# Patient Record
Sex: Female | Born: 1970 | Race: White | Hispanic: No | Marital: Married | Smoking: Never smoker
Health system: Southern US, Community
[De-identification: ages and names within clinical notes are randomized; demographics above are authoritative.]

## PROBLEM LIST (undated history)

## (undated) DIAGNOSIS — M419 Scoliosis, unspecified: Secondary | ICD-10-CM

## (undated) DIAGNOSIS — M810 Age-related osteoporosis without current pathological fracture: Secondary | ICD-10-CM

---

## 2004-04-21 ENCOUNTER — Other Ambulatory Visit: Admission: RE | Admit: 2004-04-21 | Discharge: 2004-04-21 | Payer: Self-pay | Admitting: Obstetrics and Gynecology

## 2021-03-26 ENCOUNTER — Other Ambulatory Visit: Payer: Self-pay

## 2021-03-26 ENCOUNTER — Emergency Department (HOSPITAL_BASED_OUTPATIENT_CLINIC_OR_DEPARTMENT_OTHER)
Admission: EM | Admit: 2021-03-26 | Discharge: 2021-03-26 | Disposition: A | Discharge status: Home / Self Care | Payer: No Typology Code available for payment source | Attending: Emergency Medicine | Admitting: Emergency Medicine

## 2021-03-26 ENCOUNTER — Encounter (HOSPITAL_BASED_OUTPATIENT_CLINIC_OR_DEPARTMENT_OTHER): Payer: Self-pay | Admitting: Emergency Medicine

## 2021-03-26 ENCOUNTER — Emergency Department (HOSPITAL_BASED_OUTPATIENT_CLINIC_OR_DEPARTMENT_OTHER): Payer: No Typology Code available for payment source | Admitting: Radiology

## 2021-03-26 DIAGNOSIS — M24811 Other specific joint derangements of right shoulder, not elsewhere classified: Secondary | ICD-10-CM | POA: Insufficient documentation

## 2021-03-26 DIAGNOSIS — S4991XA Unspecified injury of right shoulder and upper arm, initial encounter: Secondary | ICD-10-CM | POA: Insufficient documentation

## 2021-03-26 DIAGNOSIS — T1490XA Injury, unspecified, initial encounter: Secondary | ICD-10-CM

## 2021-03-26 DIAGNOSIS — Y93E5 Activity, floor mopping and cleaning: Secondary | ICD-10-CM | POA: Insufficient documentation

## 2021-03-26 DIAGNOSIS — X509XXA Other and unspecified overexertion or strenuous movements or postures, initial encounter: Secondary | ICD-10-CM | POA: Diagnosis not present

## 2021-03-26 HISTORY — DX: Scoliosis, unspecified: M41.9

## 2021-03-26 HISTORY — DX: Age-related osteoporosis without current pathological fracture: M81.0

## 2021-03-26 MED ORDER — OXYCODONE-ACETAMINOPHEN 5-325 MG PO TABS
1.0000 | ORAL_TABLET | Freq: Once | ORAL | Status: AC
  Administered 2021-03-26: 1 via ORAL
  Filled 2021-03-26: qty 1

## 2021-03-26 MED ORDER — PREDNISONE 50 MG PO TABS
60.0000 mg | ORAL_TABLET | Freq: Once | ORAL | Status: AC
  Administered 2021-03-26: 60 mg via ORAL
  Filled 2021-03-26: qty 1

## 2021-03-26 MED ORDER — OXYCODONE-ACETAMINOPHEN 5-325 MG PO TABS: 1.0000 | ORAL_TABLET | ORAL | 0 refills | Status: AC | PRN

## 2021-03-26 MED ORDER — PREDNISONE 20 MG PO TABS: ORAL_TABLET | ORAL | 0 refills | Status: AC

## 2021-03-26 NOTE — ED Triage Notes (Signed)
Pt via pov from home with right shoulder pain since Monday. She reports that she was scrubbing and drying a floor and feels like she may have injured it somehow. Reports she is able to move it, but reports "excruciating pain" when moving it certain ways. Pt alert & oriented, nad noted.

## 2021-03-26 NOTE — Discharge Instructions (Addendum)
1.  You have been given a dose of prednisone in the emergency department.  Fill your prescription and start taking prednisone tomorrow evening as prescribed for the next 4 days. 2.  You may take 1 Percocet tablet every 4 hours for pain control.  When the prednisone is improving your symptoms and pain is improving, you may transition to over-the-counter extra strength Tylenol every 6 hours. 3.  Continue icing.  You may begin to move the shoulder in very small increments as the pain improves.  Only do as much as you can without causing pain.  If you are having pain continue to rest the shoulder some more. 4.  You can schedule a follow-up appointment with the [facility] listed in your discharge instructions.  Otherwise, contact an orthopedic specialist in your hometown and schedule appointment soon after you return home.

## 2021-03-26 NOTE — ED Provider Notes (Signed)
[facility] EMERGENCY DEPT Provider Note   CSN: [id number] Arrival date & time: 03/26/21  1709     History Chief Complaint  Patient presents with   Shoulder Pain    Tammie Miller is a 50 y.o. female.  HPI Patient reports that she had done some extensive cleaning of the floor on Monday.  She reports before that she was not having any shoulder pain or problems.  She reports over the ensuing day or so she started to get a sore right shoulder and then the pain began to really escalate to the point that she cannot move the shoulder all without excruciating pain.  No numbness or weakness into the hand.  She has been trying icing and anti-inflammatory medication but is continued to have severe pain.  Denies ever having similar type of problem.    Past Medical History:  Diagnosis Date   Osteoporosis    Scoliosis     There are no problems to display for this patient.      OB History   No obstetric history on file.     No family history on file.  Social History   Tobacco Use   Smoking status: Never   Smokeless tobacco: Never  Vaping Use   Vaping Use: Never used  Substance Use Topics   Alcohol use: Not Currently   Drug use: Not Currently    Home Medications Prior to Admission medications   Medication Sig Start Date End Date Taking? Authorizing Provider  oxyCODONE-acetaminophen (PERCOCET) 5-325 MG tablet Take 1 tablet by mouth every 4 (four) hours as needed. 03/26/21  Yes Arby Barrette, MD  predniSONE (DELTASONE) 20 MG tablet 2 tabs po daily x 4 days 03/26/21  Yes Arby Barrette, MD    Allergies    Patient has no known allergies.  Review of Systems   Review of Systems Constitutional: No fever no chills no malaise Respiratory: No cough no shortness of breath no chest pain Physical Exam Updated Vital Signs BP 137/77 (BP Location: Right Arm)   Pulse 85   Temp 98.3 F (36.8 C) (Oral)   Resp 20   Ht 5\' 9"  (1.753 m)   Wt 70.3 kg   SpO2 97%   BMI  22.89 kg/m   Physical Exam Constitutional:      Comments: Alert and nontoxic.  Patient appears to be in a lot of pain.  No respiratory distress.  Cardiovascular:     Rate and Rhythm: Normal rate and regular rhythm.  Pulmonary:     Effort: Pulmonary effort is normal.     Breath sounds: Normal breath sounds.  Musculoskeletal:     Cervical back: Neck supple.     Comments: Patient has severe pain with any forward flexion beyond about 15 degrees with the right shoulder.  Also severe pain with any ad duction.  Patient endorses pain over the anterior shoulder in the area of the biceps tendon.  No visible shoulder swelling or redness.  No tenderness over the clavicle or the trapezius.  Neurovascularly intact.  Hand is warm and dry.  Normal motion of the fingers and the wrist.  Radial pulse 2+  Skin:    General: Skin is warm and dry.  Neurological:     General: No focal deficit present.     Coordination: Coordination normal.    ED Results / Procedures / Treatments   Labs (all labs ordered are listed, but only abnormal results are displayed) Labs Reviewed - No data to display  EKG  None  Radiology DG Shoulder Right  Result Date: 03/26/2021 CLINICAL DATA:  Right shoulder pain EXAM: RIGHT SHOULDER - 2+ VIEW COMPARISON:  None. FINDINGS: Three view radiograph right shoulder demonstrates normal alignment. No fracture or dislocation. There is a semi lunar calcific density lateral to the greater tuberosity of the right humeral head which may reflect changes of calcific bursitis. Acromioclavicular and glenohumeral joint spaces are preserved. Limited evaluation of the right hemithorax is unremarkable. 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 No acute fracture or dislocation. Electronically Signed   By: Helyn Numbers MD   On: 03/26/2021 21:44    Procedures Procedures   Medications Ordered in ED Medications  predniSONE (DELTASONE) tablet 60 mg (60 mg Oral  Given 03/26/21 2255)  oxyCODONE-acetaminophen (PERCOCET/ROXICET) 5-325 MG per tablet 1 tablet (1 tablet Oral Given 03/26/21 2255)    ED Course  I have reviewed the triage vital signs and the nursing notes.  Pertinent labs & imaging results that were available during my care of the patient were reviewed by me and considered in my medical decision making (see chart for details).    MDM Rules/Calculators/A&P                          Patient describes vigorous episode of cleaning the floor several days earlier and then incrementally increasing right shoulder pain and dysfunction.  Patient is neurovascularly intact.  This is consistent with internal shoulder derangement we discussed diagnoses such as bursitis, tendinitis and rotator cuff dysfunction.  X-ray does not show evidence of fracture or dislocation.  Patient will be treated with a burst of steroids and short course of Percocet for pain control.  Discussed patient's follow-up plan and home management.  Patient is visiting from out of town, information provided for follow-up locally or follow-up with orthopedics at her hometown. Final Clinical Impression(s) / ED Diagnoses Final diagnoses:  Injury  Internal derangement of right shoulder    Rx / DC Orders ED Discharge Orders          Ordered    oxyCODONE-acetaminophen (PERCOCET) 5-325 MG tablet  Every 4 hours PRN        03/26/21 2315    predniSONE (DELTASONE) 20 MG tablet        03/26/21 2315             Arby Barrette, MD 03/26/21 2323

## 2022-01-04 IMAGING — DX DG SHOULDER 2+V*R*
3 series · 3 of 3 positions shown · non-contrast
Comparison: None.

CLINICAL DATA: Right shoulder pain

EXAM:
RIGHT SHOULDER - 2+ VIEW

[shoulder grashey]
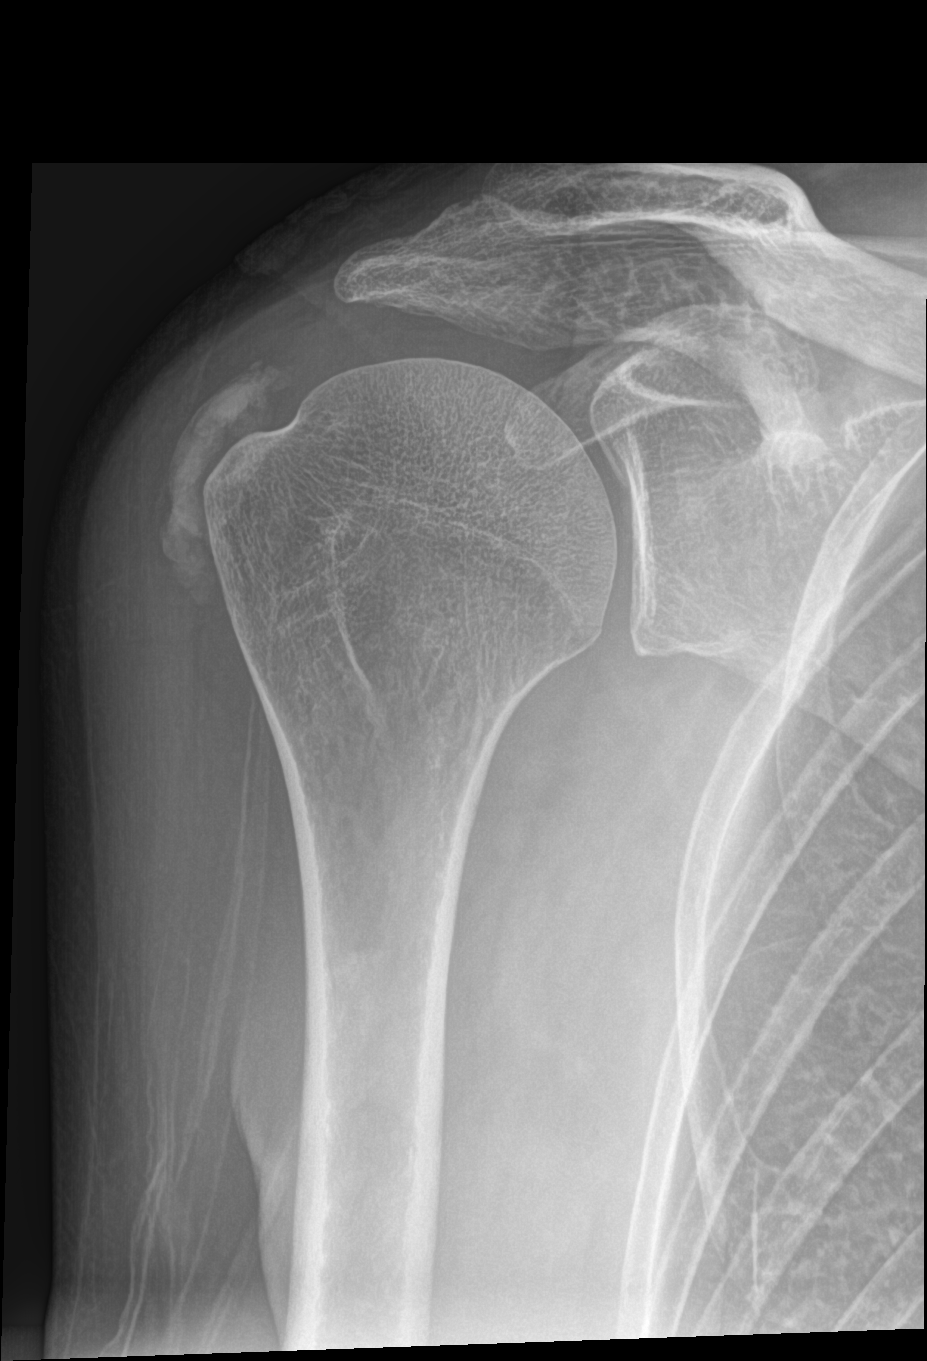

[shoulder y view]
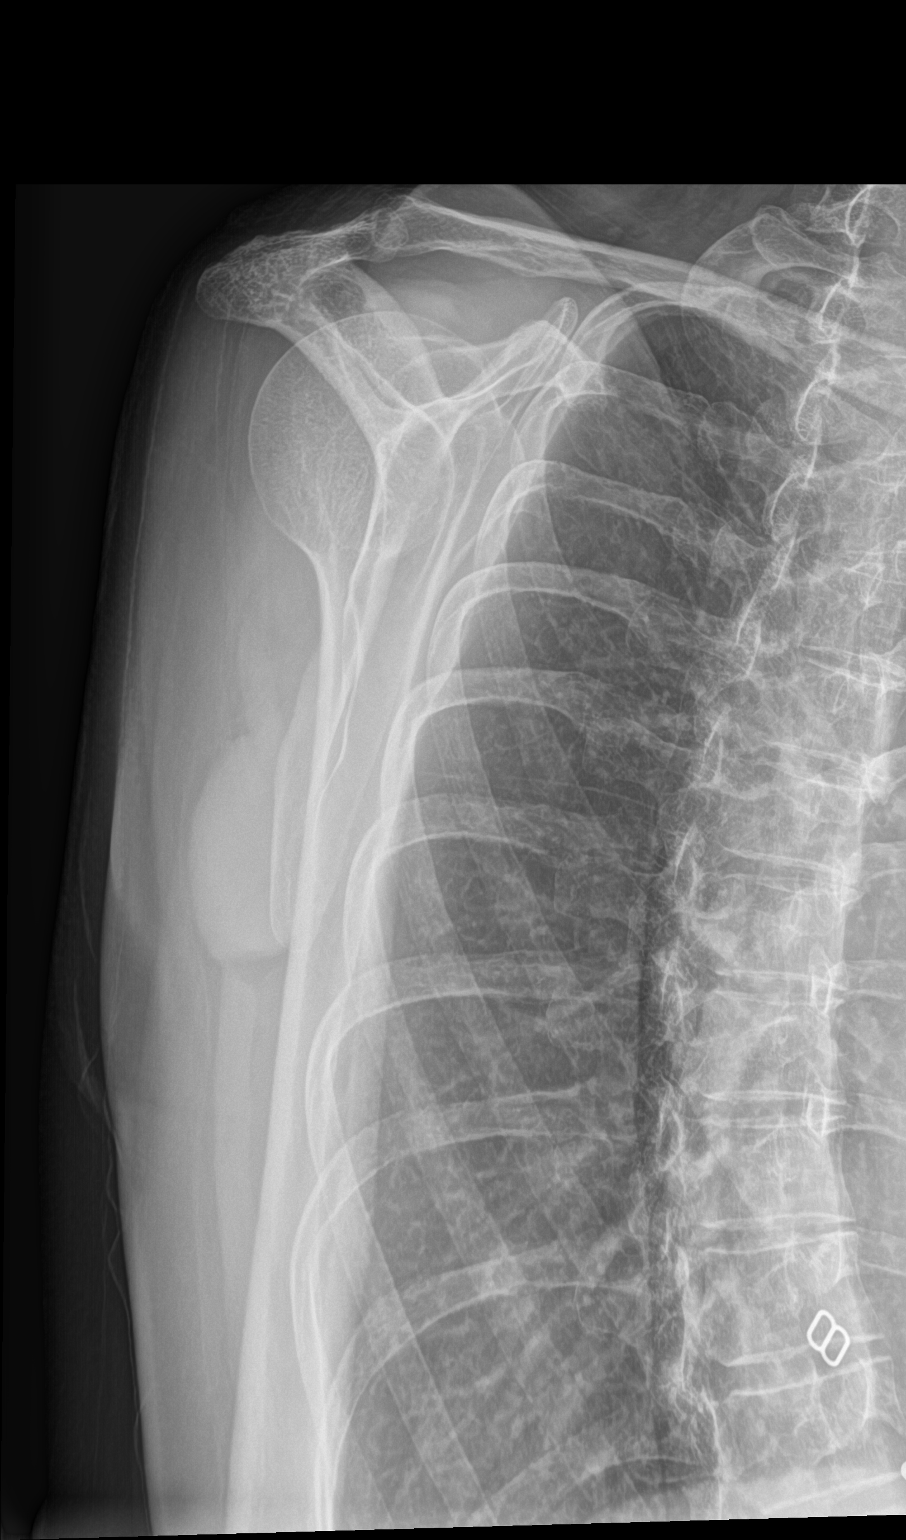

[shoulder ap]
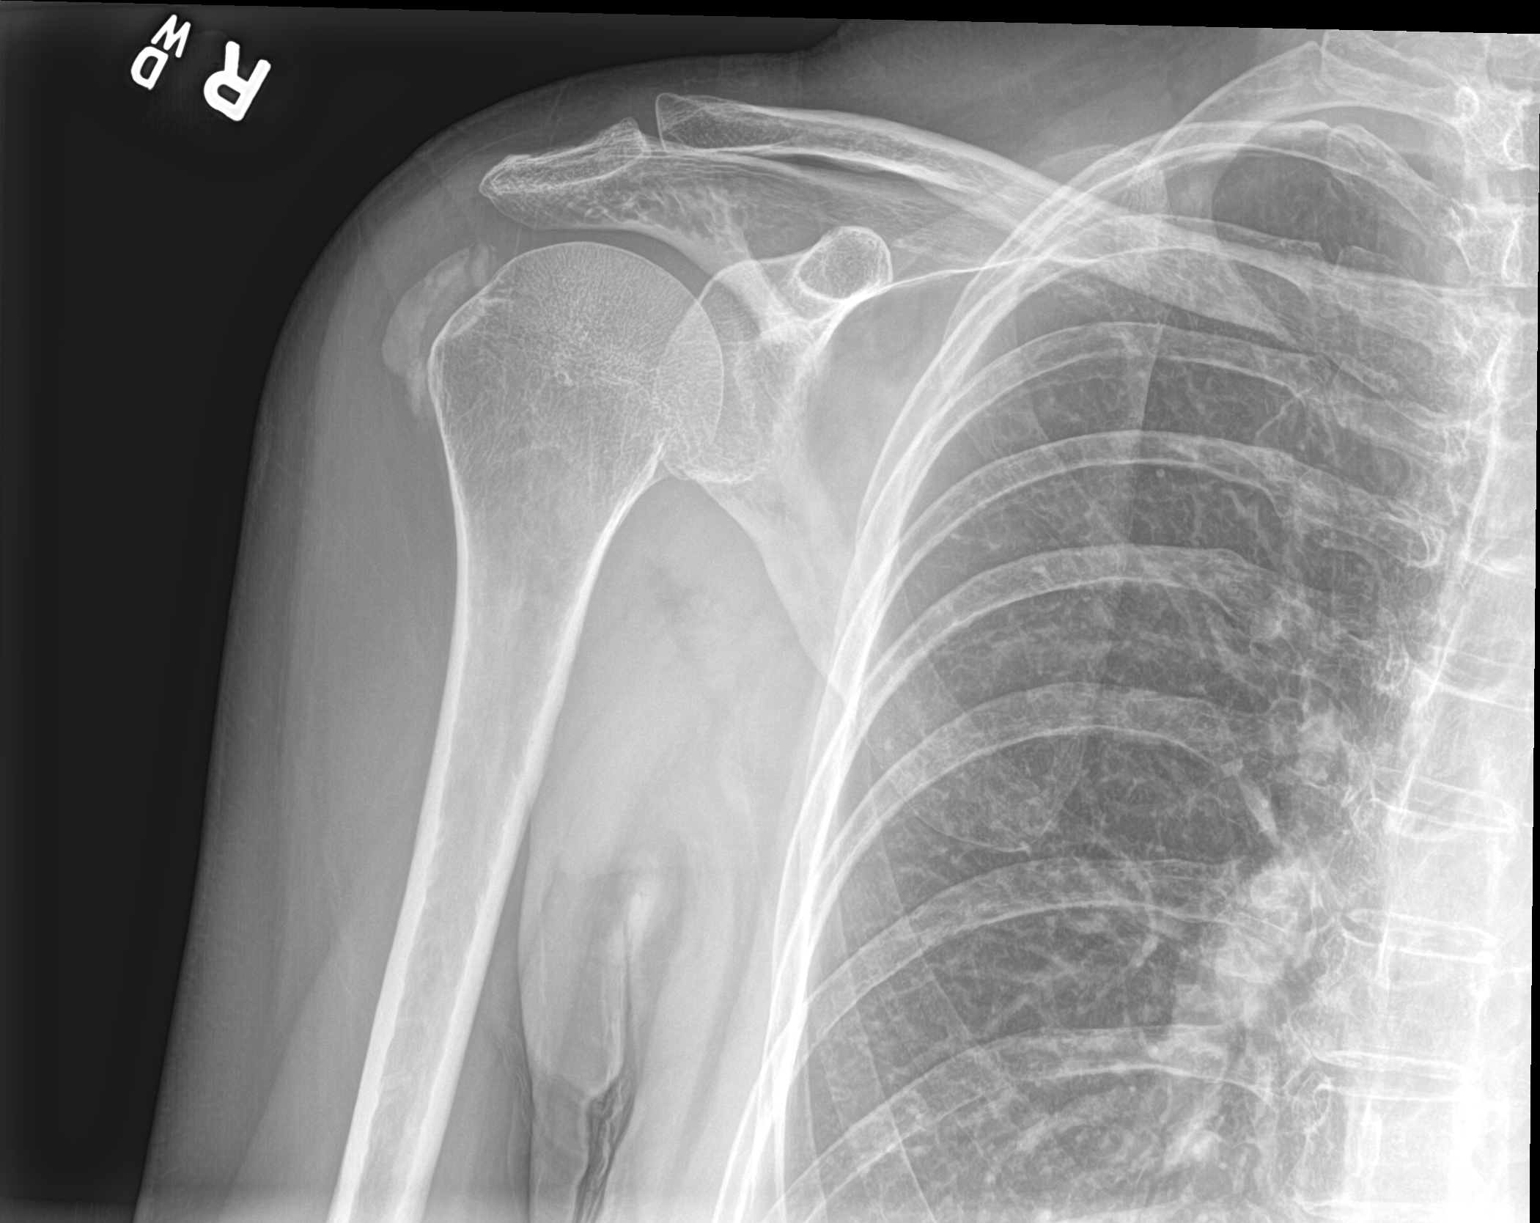

[3 of 3 positions shown; findings below may reference images not displayed]

FINDINGS: Three view radiograph right shoulder demonstrates normal alignment.
No fracture or dislocation. There is a semi lunar calcific density
lateral to the greater tuberosity of the right humeral head which
may reflect changes of calcific bursitis. Acromioclavicular and
glenohumeral joint spaces are preserved. Limited evaluation of the
right hemithorax is unremarkable.
IMPRESSION: Possible changes of calcific bursitis superolateral to the humeral
head.

No acute fracture or dislocation.
